# Patient Record
Sex: Female | Born: 1995 | Race: White | Hispanic: No | Marital: Single | Smoking: Never smoker
Health system: Southern US, Community
[De-identification: ages and names within clinical notes are randomized; demographics above are authoritative.]

---

## 2019-08-10 ENCOUNTER — Emergency Department: Payer: Medicaid Other

## 2019-08-10 ENCOUNTER — Other Ambulatory Visit: Payer: Self-pay

## 2019-08-10 ENCOUNTER — Emergency Department
Admission: EM | Admit: 2019-08-10 | Discharge: 2019-08-10 | Disposition: A | Discharge status: Home / Self Care | Payer: Medicaid Other | Attending: Emergency Medicine | Admitting: Emergency Medicine

## 2019-08-10 ENCOUNTER — Encounter: Payer: Self-pay | Admitting: Emergency Medicine

## 2019-08-10 DIAGNOSIS — Z20822 Contact with and (suspected) exposure to covid-19: Secondary | ICD-10-CM | POA: Insufficient documentation

## 2019-08-10 DIAGNOSIS — J069 Acute upper respiratory infection, unspecified: Secondary | ICD-10-CM | POA: Insufficient documentation

## 2019-08-10 DIAGNOSIS — R05 Cough: Secondary | ICD-10-CM | POA: Diagnosis present

## 2019-08-10 LAB — GROUP A STREP BY PCR: Group A Strep by PCR: NOT DETECTED

## 2019-08-10 LAB — SARS CORONAVIRUS 2 BY RT PCR (HOSPITAL ORDER, PERFORMED IN ~~LOC~~ HOSPITAL LAB): SARS Coronavirus 2: NEGATIVE

## 2019-08-10 LAB — INFLUENZA PANEL BY PCR (TYPE A & B)
Influenza A By PCR: NEGATIVE
Influenza B By PCR: NEGATIVE

## 2019-08-10 MED ORDER — ONDANSETRON 4 MG PO TBDP: 4.0000 mg | ORAL_TABLET | Freq: Three times a day (TID) | ORAL | 0 refills | Status: AC | PRN

## 2019-08-10 MED ORDER — BENZONATATE 100 MG PO CAPS: 100.0000 mg | ORAL_CAPSULE | Freq: Four times a day (QID) | ORAL | 0 refills | Status: AC | PRN

## 2019-08-10 MED ORDER — IBUPROFEN 600 MG PO TABS
600.0000 mg | ORAL_TABLET | Freq: Once | ORAL | Status: AC
  Administered 2019-08-10: 600 mg via ORAL
  Filled 2019-08-10: qty 1

## 2019-08-10 MED ORDER — IBUPROFEN 600 MG PO TABS: 600.0000 mg | ORAL_TABLET | Freq: Four times a day (QID) | ORAL | 0 refills | Status: AC | PRN

## 2019-08-10 MED ORDER — ONDANSETRON 4 MG PO TBDP
4.0000 mg | ORAL_TABLET | Freq: Once | ORAL | Status: AC
  Administered 2019-08-10: 4 mg via ORAL
  Filled 2019-08-10: qty 1

## 2019-08-10 NOTE — ED Provider Notes (Signed)
[facility] Emergency Department Provider Note  ____________________________________________  Time seen: Approximately 6:21 AM  I have reviewed the triage vital signs and the nursing notes.   HISTORY  Chief Complaint Cough and Fever   HPI Donna Hale is a 24 y.o. female with no significant past medical history who presents for evaluation of cough and fever x2 days.   Patient is complaining of body aches, subjective fever, cough that feels productive although no phlegm has been coming up, sore throat, headache, nausea, vomiting.  Her symptoms have been constant.  She has missed work for 2 days because of them.  No known exposures to Covid.  No Covid vaccination.  No chest pain, no abdominal pain, no diarrhea.  No personal or family history of blood clots, no recent travel immobilization, no leg pain or swelling, no hemoptysis or exogenous hormones.  PMH None  Allergies Patient has no known allergies.  No family history on file.  Social History Social History   Tobacco Use  . Smoking status: Never Smoker  . Smokeless tobacco: Never Used  Substance Use Topics  . Alcohol use: Yes  . Drug use: Not Currently    Review of Systems  Constitutional: + Subjective fever, body aches Eyes: Negative for visual changes. ENT: + sore throat. Neck: No neck pain  Cardiovascular: Negative for chest pain. Respiratory: Negative for shortness of breath. + cough Gastrointestinal: Negative for abdominal pain, or diarrhea. + N/V Genitourinary: Negative for dysuria. Musculoskeletal: Negative for back pain. Skin: Negative for rash. Neurological: Negative for weakness or numbness. + HA Psych: No SI or HI  ____________________________________________   PHYSICAL EXAM:  VITAL SIGNS: ED Triage Vitals  Enc Vitals Group     BP 08/10/19 0044 114/86     Pulse Rate 08/10/19 0044 81     Resp 08/10/19 0044 18     Temp 08/10/19 0044 98.6 F (37 C)     Temp Source  08/10/19 0044 Oral     SpO2 08/10/19 0044 95 %     Weight 08/10/19 0045 130 lb (59 kg)     Height 08/10/19 0045 5\' 5"  (1.651 m)     Head Circumference --      Peak Flow --      Pain Score 08/10/19 0045 6     Pain Loc --      Pain Edu? --      Excl. in [place]? --     Constitutional: Alert and oriented. Well appearing and in no apparent distress. HEENT:      Head: Normocephalic and atraumatic.         Eyes: Conjunctivae are normal. Sclera is non-icteric.       Mouth/Throat: Mucous membranes are moist.  Oropharynx is slightly erythematous with no lesions, no peritonsillar abscess, no exudates      Neck: Supple with no signs of meningismus. Cardiovascular: Regular rate and rhythm. No murmurs, gallops, or rubs.  Respiratory: Normal respiratory effort. Lungs are clear to auscultation bilaterally. No wheezes, crackles, or rhonchi.  Gastrointestinal: Soft, non tender, and non distended with positive bowel sounds.  Musculoskeletal:  No edema, cyanosis, or erythema of extremities. Neurologic: Normal speech and language. Face is symmetric. Moving all extremities. No gross focal neurologic deficits are appreciated. Skin: Skin is warm, dry and intact. No rash noted. Psychiatric: Mood and affect are normal. Speech and behavior are normal.  ____________________________________________   LABS (all labs ordered are listed, but only abnormal results are displayed)  Labs  Reviewed  SARS CORONAVIRUS 2 BY RT PCR (HOSPITAL ORDER, PERFORMED IN [facility])  GROUP A STREP BY PCR  INFLUENZA PANEL BY PCR (TYPE A & B)   ____________________________________________  EKG  none  ____________________________________________  RADIOLOGY  I have personally reviewed the images performed during this visit and I agree with the Radiologist's read.   Interpretation by Radiologist:  DG Chest 2 View  Result Date: 08/10/2019 CLINICAL DATA:  Cough EXAM: CHEST - 2 VIEW COMPARISON:  None. FINDINGS: The  heart size and mediastinal contours are within normal limits. Both lungs are clear. The visualized skeletal structures are unremarkable. IMPRESSION: No active cardiopulmonary disease. Electronically Signed   By: Jonna Clark M.D.   On: 08/10/2019 01:30     ____________________________________________   PROCEDURES  Procedure(s) performed:yes .1-3 Lead EKG Interpretation Performed by: Nita Sickle, MD Authorized by: Nita Sickle, MD     Interpretation: normal     ECG rate assessment: normal     Rhythm: sinus rhythm     Ectopy: none     Conduction: normal     Critical Care performed:  None ____________________________________________   INITIAL IMPRESSION / ASSESSMENT AND PLAN / ED COURSE  24 y.o. female with no significant past medical history who presents for evaluation of cough, sore throat, body aches, vomiting, and fever x2 days.   Patient is extremely well-appearing in no distress with normal vital signs, afebrile with no antipyretics prior to arrival.  She has been here for 6 hours and continues to have no fever.  No meningeal signs, oropharynx is slightly erythematous, lungs are clear to auscultation.  No signs of dehydration.  Chest x-ray negative for pneumonia, visualized by me confirmed by radiology.  Covid swab is negative.  Will check for flu and strep.  Will give ibuprofen and Zofran for symptom relief.  Differential diagnosis viral illness versus Covid versus flu versus pneumonia.  Anticipate discharge home with supportive care.  No old medical records for review.  _________________________ 7:49 AM on 08/10/2019 -----------------------------------------  Covid, flu, strep negative.  Chest x-ray negative for pneumonia.  Patient be discharged home on supportive care with follow-up with PCP.  Discussed my standard return precautions.    _____________________________________________ Please note:  Patient was evaluated in Emergency Department today for the  symptoms described in the history of present illness. Patient was evaluated in the context of the global COVID-19 pandemic, which necessitated consideration that the patient might be at risk for infection with the SARS-CoV-2 virus that causes COVID-19. Institutional protocols and algorithms that pertain to the evaluation of patients at risk for COVID-19 are in a state of rapid change based on information released by regulatory bodies including the CDC and federal and state organizations. These policies and algorithms were followed during the patient's care in the ED.  Some ED evaluations and interventions may be delayed as a result of limited staffing during the pandemic.   [place] Controlled Substance Database was reviewed by me. ____________________________________________   FINAL CLINICAL IMPRESSION(S) / ED DIAGNOSES   Final diagnoses:  Viral URI with cough      NEW MEDICATIONS STARTED DURING THIS VISIT:  ED Discharge Orders         Ordered    ondansetron (ZOFRAN ODT) 4 MG disintegrating tablet  Every 8 hours PRN     08/10/19 0653    ibuprofen (ADVIL) 600 MG tablet  Every 6 hours PRN     08/10/19 0653    benzonatate (TESSALON PERLES) 100 MG  capsule  Every 6 hours PRN     08/10/19 8110           Note:  This document was prepared using Dragon voice recognition software and may include unintentional dictation errors.    Don Perking, [place], MD 08/10/19 [phone]

## 2019-08-10 NOTE — ED Triage Notes (Signed)
Patient reports that she started having a cough and fever Thursday night. Patient states that she had a fever Thursday and Friday. Patient states that states that she did not check her temperature. Patient states that she still has a cough.

## 2019-08-10 NOTE — ED Notes (Signed)
Called lab to add influenza A&B panel to earlier covid swab.

## 2021-08-19 IMAGING — CR DG CHEST 2V
1 series · 2 of 2 positions shown · non-contrast
Comparison: None.

CLINICAL DATA: Cough

EXAM:
CHEST - 2 VIEW

[Series 1: dg chest 2 view · 0.14mm/px · 2 of 2 slices shown]
[im 1/2]
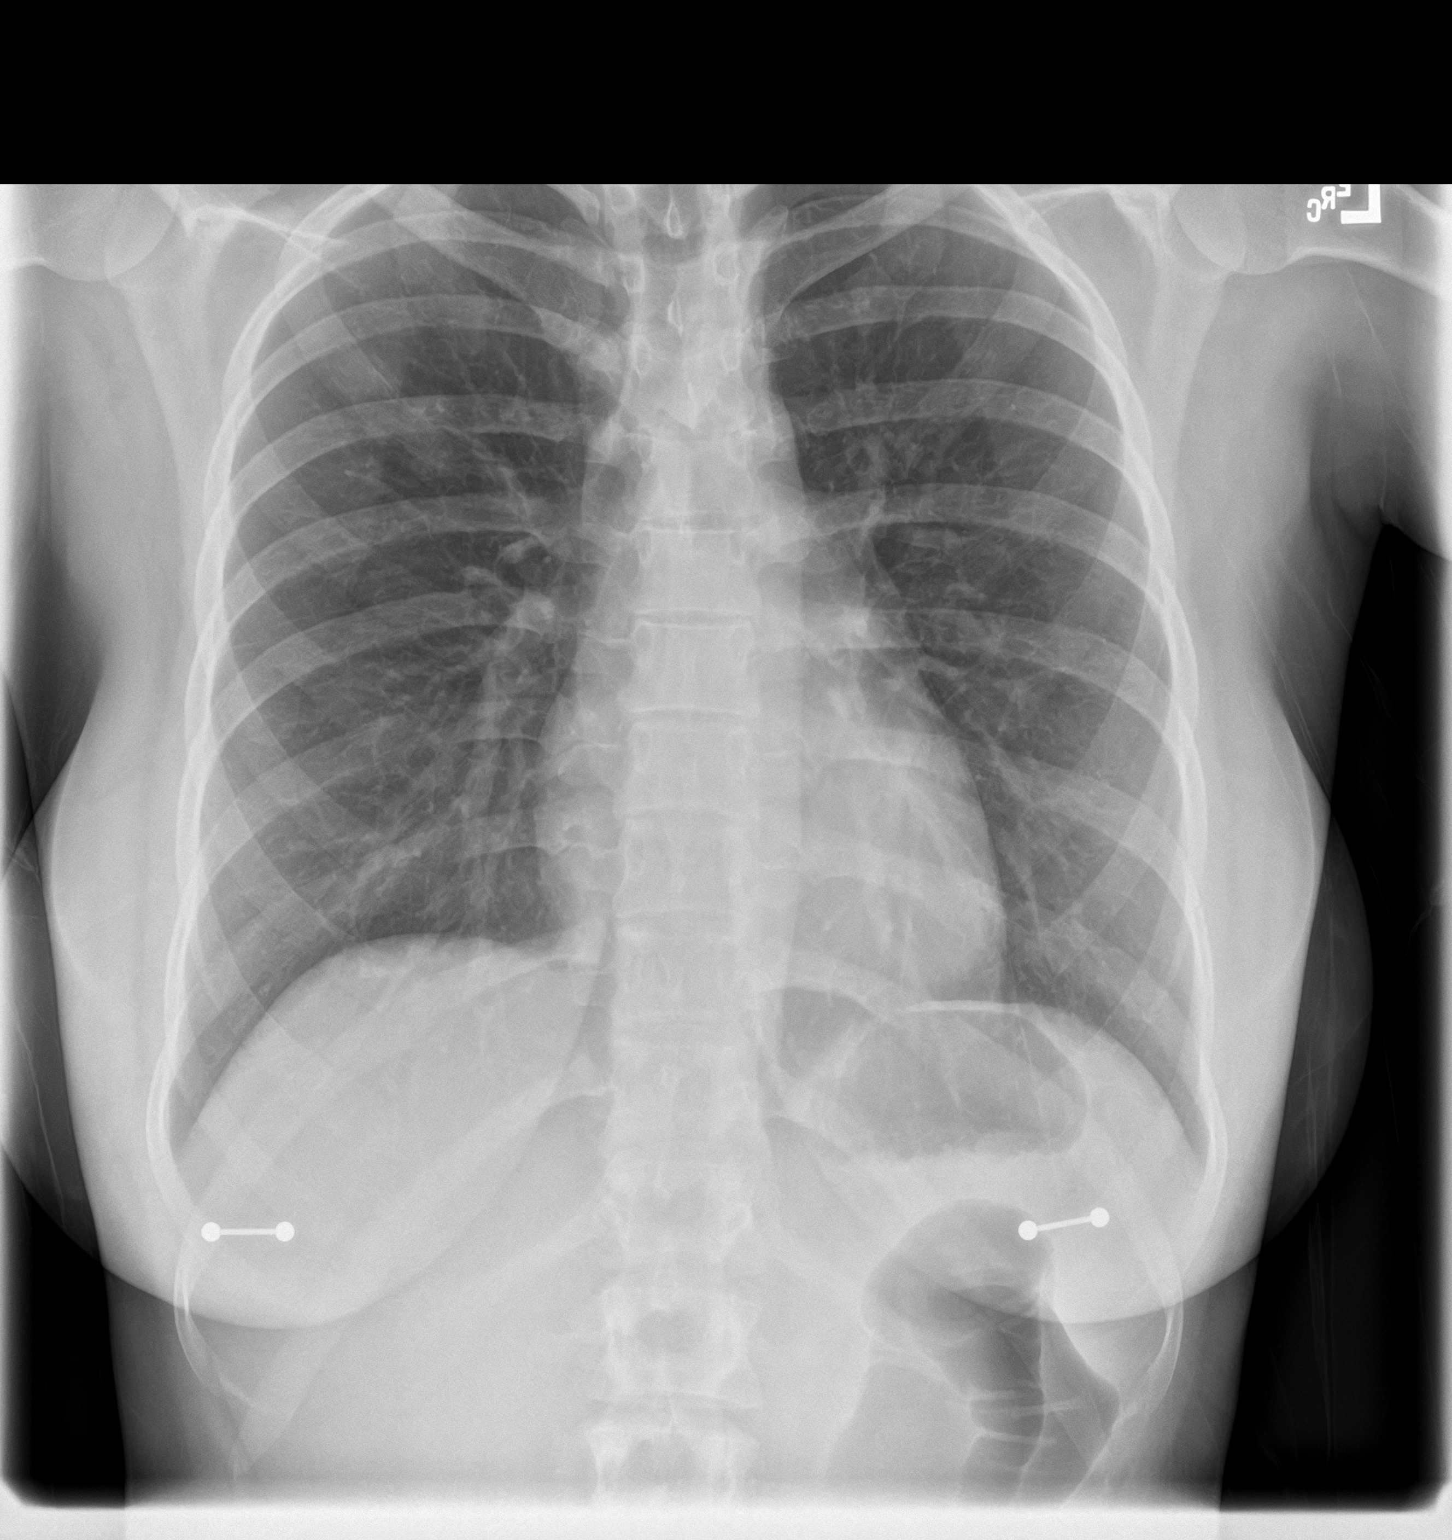
[im 2/2]
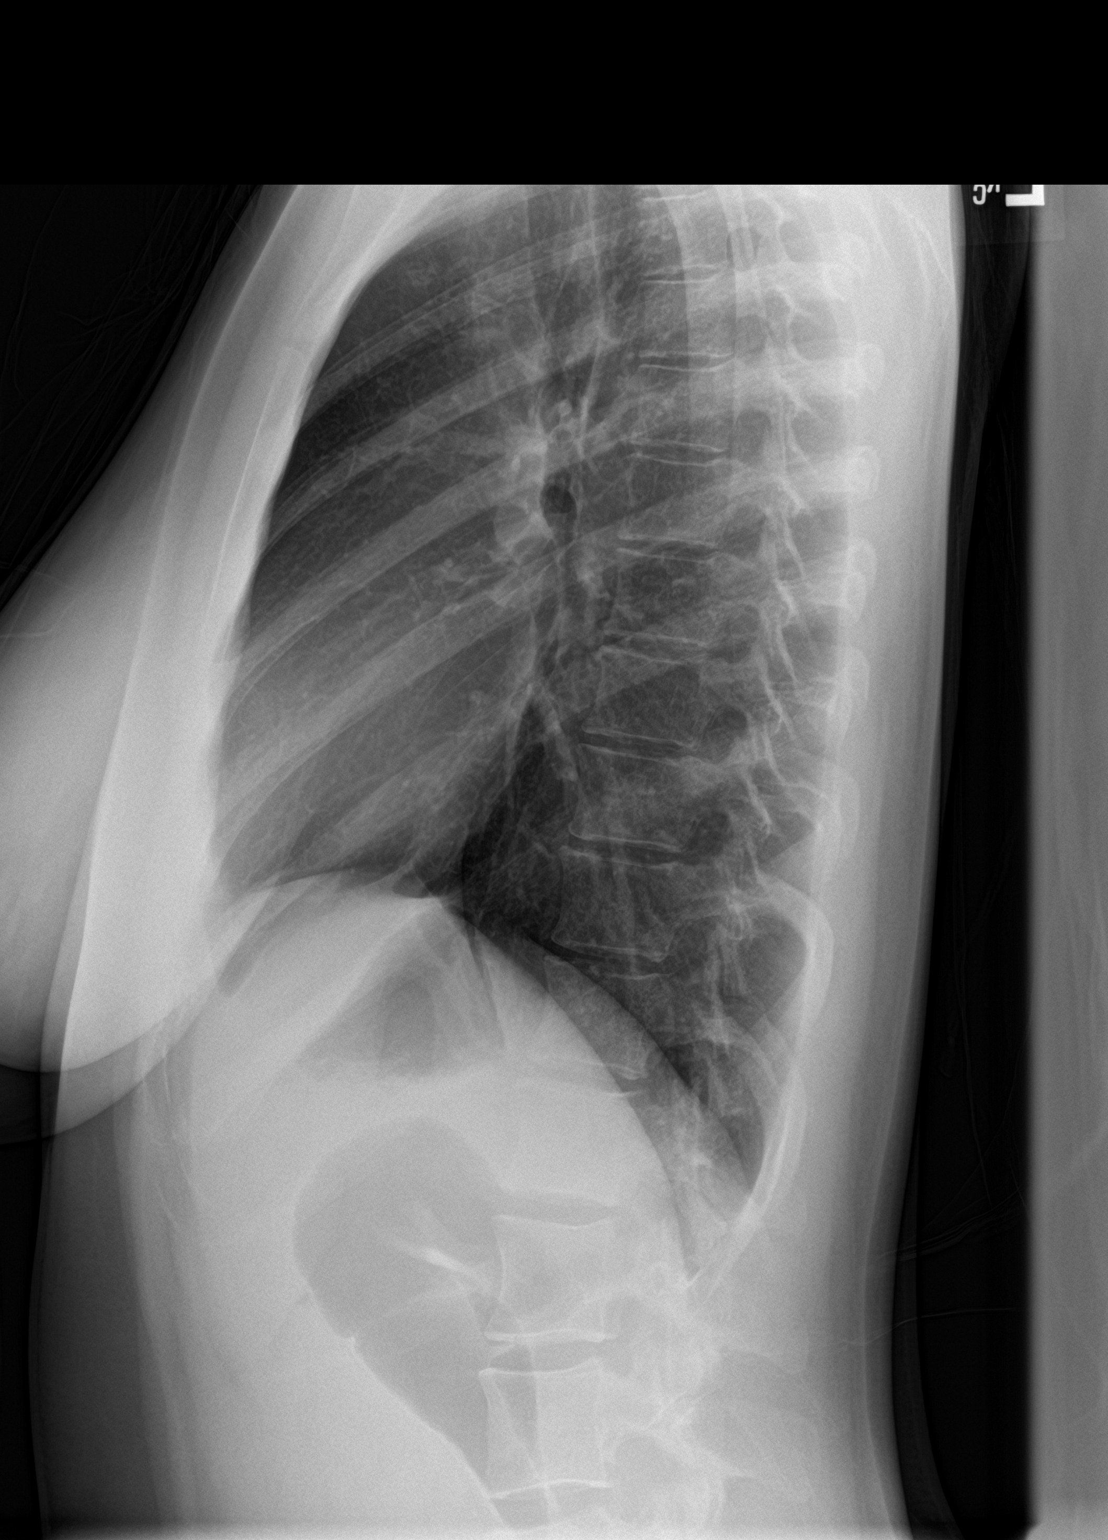

[2 of 2 positions shown; findings below may reference images not displayed]

FINDINGS: The heart size and mediastinal contours are within normal limits.
Both lungs are clear. The visualized skeletal structures are
unremarkable.
IMPRESSION: No active cardiopulmonary disease.
